# Patient Record
Sex: Male | Born: 1935 | Race: White | Hispanic: No | Marital: Married
Health system: Southern US, Community
[De-identification: ages and names within clinical notes are randomized; demographics above are authoritative.]

---

## 2006-02-12 ENCOUNTER — Ambulatory Visit: Payer: Self-pay | Admitting: Gastroenterology

## 2010-03-25 ENCOUNTER — Inpatient Hospital Stay: Payer: Self-pay | Admitting: Internal Medicine

## 2010-06-29 ENCOUNTER — Ambulatory Visit: Payer: Self-pay | Admitting: Gastroenterology

## 2011-01-08 ENCOUNTER — Ambulatory Visit: Payer: Self-pay | Admitting: Family Medicine

## 2011-01-23 ENCOUNTER — Ambulatory Visit: Payer: Self-pay | Admitting: Internal Medicine

## 2011-01-24 ENCOUNTER — Ambulatory Visit: Payer: Self-pay | Admitting: Specialist

## 2011-03-22 ENCOUNTER — Ambulatory Visit: Payer: Self-pay | Admitting: Cardiothoracic Surgery

## 2011-04-30 ENCOUNTER — Inpatient Hospital Stay: Payer: Self-pay | Admitting: Internal Medicine

## 2011-05-21 ENCOUNTER — Encounter: Payer: Self-pay | Admitting: Internal Medicine

## 2011-06-12 ENCOUNTER — Observation Stay: Payer: Self-pay | Admitting: Internal Medicine

## 2011-06-19 ENCOUNTER — Encounter: Payer: Self-pay | Admitting: Internal Medicine

## 2011-06-29 ENCOUNTER — Inpatient Hospital Stay: Payer: Self-pay | Admitting: Student

## 2011-06-29 ENCOUNTER — Ambulatory Visit: Payer: Self-pay | Admitting: Internal Medicine

## 2011-07-02 ENCOUNTER — Ambulatory Visit: Payer: Self-pay | Admitting: Internal Medicine

## 2011-07-05 ENCOUNTER — Encounter: Payer: Self-pay | Admitting: Internal Medicine

## 2011-07-13 ENCOUNTER — Inpatient Hospital Stay: Payer: Self-pay | Admitting: Internal Medicine

## 2011-08-02 ENCOUNTER — Ambulatory Visit: Payer: Self-pay | Admitting: Internal Medicine

## 2011-08-05 ENCOUNTER — Encounter: Payer: Self-pay | Admitting: Internal Medicine

## 2011-09-01 ENCOUNTER — Encounter: Payer: Self-pay | Admitting: Internal Medicine

## 2011-09-01 ENCOUNTER — Ambulatory Visit: Payer: Self-pay | Admitting: Internal Medicine

## 2011-10-02 ENCOUNTER — Encounter: Payer: Self-pay | Admitting: Internal Medicine

## 2011-11-02 ENCOUNTER — Encounter: Payer: Self-pay | Admitting: Internal Medicine

## 2011-11-13 ENCOUNTER — Inpatient Hospital Stay: Payer: Self-pay | Admitting: Internal Medicine

## 2011-11-13 LAB — COMPREHENSIVE METABOLIC PANEL
Albumin: 2.1 g/dL — ABNORMAL LOW (ref 3.4–5.0)
Alkaline Phosphatase: 87 U/L (ref 50–136)
BUN: 30 mg/dL — ABNORMAL HIGH (ref 7–18)
Bilirubin,Total: 0.5 mg/dL (ref 0.2–1.0)
Chloride: 100 mmol/L (ref 98–107)
Creatinine: 1.1 mg/dL (ref 0.60–1.30)
EGFR (African American): >60
Glucose: 207 mg/dL — ABNORMAL HIGH (ref 65–99)
Osmolality: 290 (ref 275–301)
SGOT(AST): 34 U/L (ref 15–37)
SGPT (ALT): 40 U/L
Sodium: 139 mmol/L (ref 136–145)
Total Protein: 6.7 g/dL (ref 6.4–8.2)

## 2011-11-13 LAB — CBC
HCT: 38.8 % — ABNORMAL LOW (ref 40.0–52.0)
MCH: 27.9 pg (ref 26.0–34.0)
MCV: 88 fL (ref 80–100)
Platelet: 185 10*3/uL (ref 150–440)
RBC: 4.39 10*6/uL — ABNORMAL LOW (ref 4.40–5.90)
RDW: 18 % — ABNORMAL HIGH (ref 11.5–14.5)
WBC: 15.6 10*3/uL — ABNORMAL HIGH (ref 3.8–10.6)

## 2011-11-13 LAB — URINALYSIS, COMPLETE
Bilirubin,UR: NEGATIVE
Ketone: NEGATIVE
Ph: 6 (ref 4.5–8.0)
Protein: 100
WBC UR: 230 /HPF (ref 0–5)

## 2011-11-13 LAB — CK TOTAL AND CKMB (NOT AT ARMC): CK, Total: 25 U/L — ABNORMAL LOW (ref 35–232)

## 2011-11-13 LAB — TROPONIN I: Troponin-I: 0.07 ng/mL — ABNORMAL HIGH

## 2011-11-14 LAB — CBC WITH DIFFERENTIAL/PLATELET
Basophil #: 0 10*3/uL (ref 0.0–0.1)
Eosinophil #: 0.1 10*3/uL (ref 0.0–0.7)
HCT: 31.7 % — ABNORMAL LOW (ref 40.0–52.0)
Lymphocyte #: 1.2 10*3/uL (ref 1.0–3.6)
Lymphocyte %: 12.7 %
MCH: 27.8 pg (ref 26.0–34.0)
MCHC: 32 g/dL (ref 32.0–36.0)
Monocyte #: 0.3 10*3/uL (ref 0.0–0.7)
Neutrophil %: 83.1 %
RBC: 3.65 10*6/uL — ABNORMAL LOW (ref 4.40–5.90)
RDW: 18.4 % — ABNORMAL HIGH (ref 11.5–14.5)
WBC: 9.1 10*3/uL (ref 3.8–10.6)

## 2011-11-14 LAB — COMPREHENSIVE METABOLIC PANEL
Albumin: 1.7 g/dL — ABNORMAL LOW (ref 3.4–5.0)
BUN: 25 mg/dL — ABNORMAL HIGH (ref 7–18)
Bilirubin,Total: 0.3 mg/dL (ref 0.2–1.0)
Calcium, Total: 8.3 mg/dL — ABNORMAL LOW (ref 8.5–10.1)
Chloride: 101 mmol/L (ref 98–107)
Creatinine: 0.96 mg/dL (ref 0.60–1.30)
EGFR (African American): >60
EGFR (Non-African Amer.): >60
Glucose: 83 mg/dL (ref 65–99)
Osmolality: 279 (ref 275–301)
Potassium: 4.5 mmol/L (ref 3.5–5.1)
SGOT(AST): 28 U/L (ref 15–37)
Sodium: 138 mmol/L (ref 136–145)
Total Protein: 5.5 g/dL — ABNORMAL LOW (ref 6.4–8.2)

## 2011-11-14 LAB — CK TOTAL AND CKMB (NOT AT ARMC): CK, Total: 28 U/L — ABNORMAL LOW (ref 35–232)

## 2011-11-14 LAB — MAGNESIUM
Magnesium: 1.6 mg/dL — ABNORMAL LOW
Magnesium: 2.1 mg/dL

## 2011-11-14 LAB — TROPONIN I
Troponin-I: 0.15 ng/mL — ABNORMAL HIGH
Troponin-I: 0.16 ng/mL — ABNORMAL HIGH

## 2011-11-15 LAB — BASIC METABOLIC PANEL
Anion Gap: 8 (ref 7–16)
BUN: 17 mg/dL (ref 7–18)
Calcium, Total: 8.5 mg/dL (ref 8.5–10.1)
Creatinine: 0.68 mg/dL (ref 0.60–1.30)
EGFR (African American): >60
EGFR (Non-African Amer.): >60
Glucose: 191 mg/dL — ABNORMAL HIGH (ref 65–99)
Osmolality: 284 (ref 275–301)

## 2011-11-15 LAB — CBC WITH DIFFERENTIAL/PLATELET
Basophil %: 0.1 %
Eosinophil %: 0 %
HGB: 9.7 g/dL — ABNORMAL LOW (ref 13.0–18.0)
Lymphocyte #: 0.8 10*3/uL — ABNORMAL LOW (ref 1.0–3.6)
MCH: 27.7 pg (ref 26.0–34.0)
MCV: 87 fL (ref 80–100)
Monocyte #: 0.1 10*3/uL (ref 0.0–0.7)
Monocyte %: 1.6 %
Neutrophil #: 6.6 10*3/uL — ABNORMAL HIGH (ref 1.4–6.5)
RBC: 3.5 10*6/uL — ABNORMAL LOW (ref 4.40–5.90)

## 2011-11-16 LAB — URINE CULTURE

## 2011-11-16 LAB — CULTURE, BLOOD (SINGLE)

## 2011-11-16 LAB — DIGOXIN LEVEL: Digoxin: 1.87 ng/mL

## 2011-11-17 LAB — COMPREHENSIVE METABOLIC PANEL
Albumin: 1.8 g/dL — ABNORMAL LOW (ref 3.4–5.0)
Alkaline Phosphatase: 69 U/L (ref 50–136)
BUN: 21 mg/dL — ABNORMAL HIGH (ref 7–18)
Bilirubin,Total: 0.3 mg/dL (ref 0.2–1.0)
Co2: 33 mmol/L — ABNORMAL HIGH (ref 21–32)
Creatinine: 0.67 mg/dL (ref 0.60–1.30)
Osmolality: 290 (ref 275–301)
SGPT (ALT): 53 U/L
Sodium: 140 mmol/L (ref 136–145)
Total Protein: 5.4 g/dL — ABNORMAL LOW (ref 6.4–8.2)

## 2011-11-17 LAB — CBC WITH DIFFERENTIAL/PLATELET
Basophil #: 0 10*3/uL (ref 0.0–0.1)
Basophil %: 0.3 %
Eosinophil %: 0 %
HCT: 28.2 % — ABNORMAL LOW (ref 40.0–52.0)
HGB: 9.1 g/dL — ABNORMAL LOW (ref 13.0–18.0)
Lymphocyte #: 0.6 10*3/uL — ABNORMAL LOW (ref 1.0–3.6)
Lymphocyte %: 9 %
MCV: 86 fL (ref 80–100)
Monocyte %: 3.4 %
Neutrophil #: 6.3 10*3/uL (ref 1.4–6.5)
RBC: 3.26 10*6/uL — ABNORMAL LOW (ref 4.40–5.90)
WBC: 7.2 10*3/uL (ref 3.8–10.6)

## 2011-12-31 DEATH — deceased

## 2012-09-20 IMAGING — US US EXTREM LOW VENOUS*R*
1 series · 17 of 24 positions shown · non-contrast
Comparison: none

REASON FOR EXAM: swelling
COMMENTS:

[Series 1: us extrem low venous*right* · 17 of 36 slices shown]
[im 1/36]
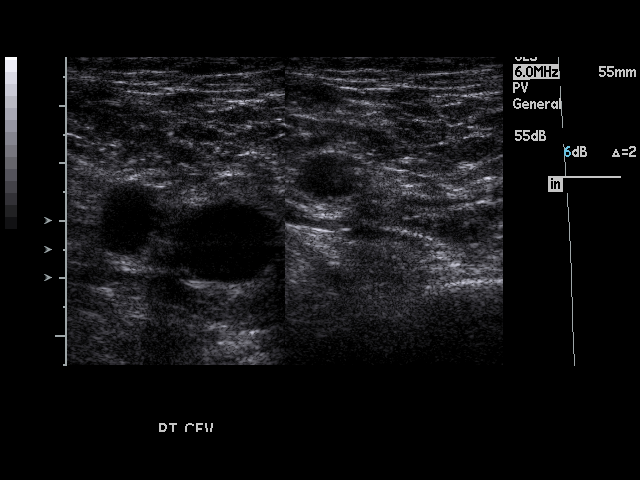
[im 4/36]
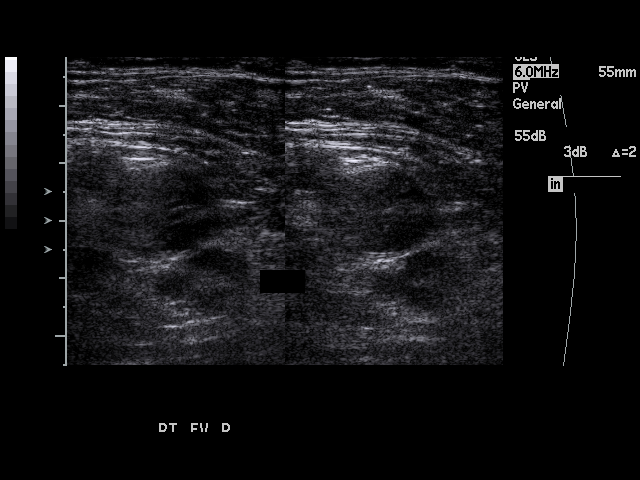
[im 5/36]
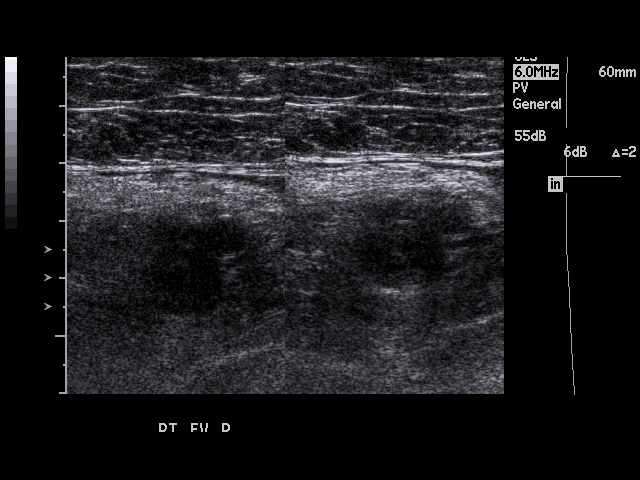
[im 7/36]
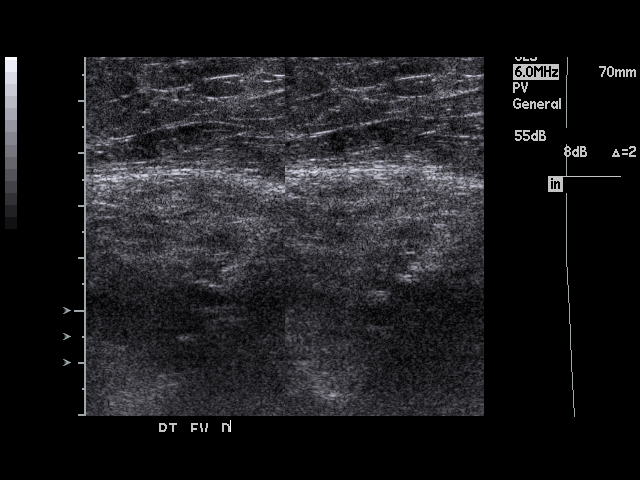
[im 10/36]
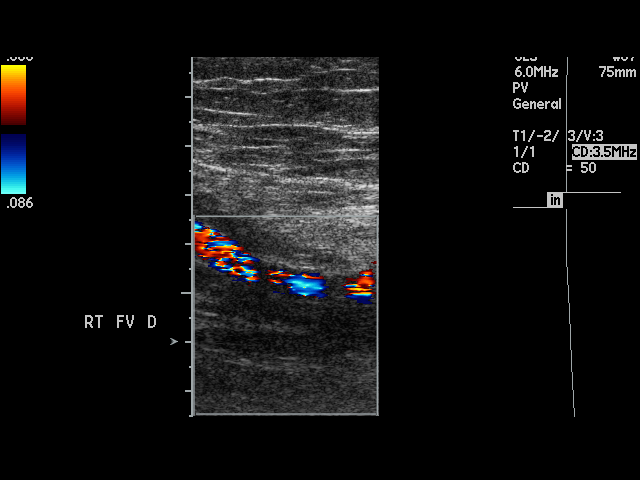
[im 11/36]
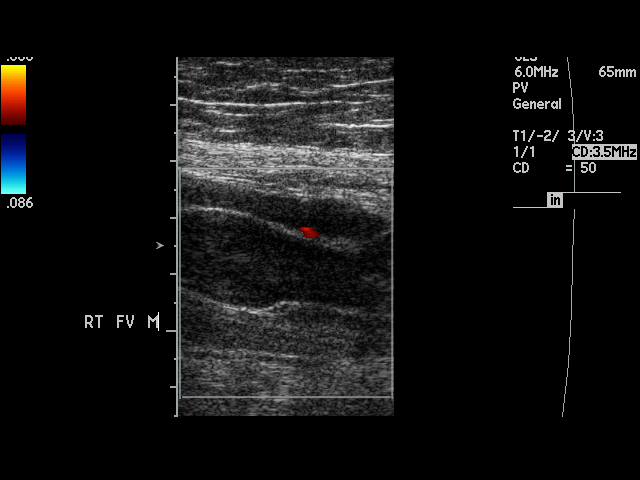
[im 14/36]
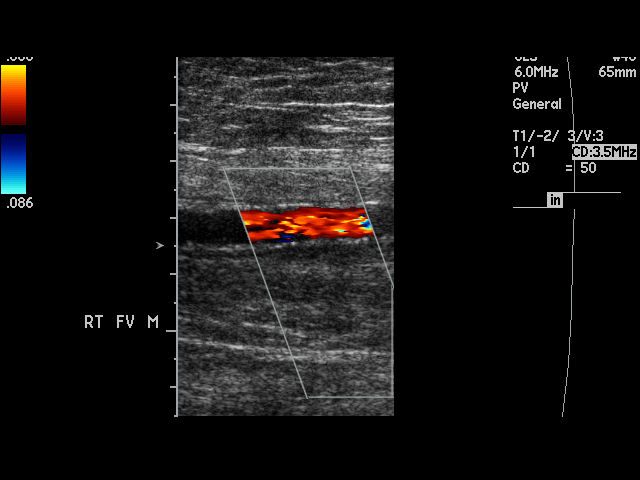
[im 16/36]
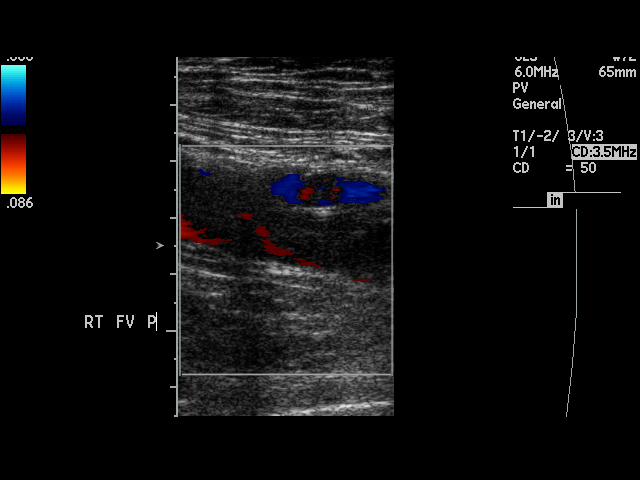
[im 19/36]
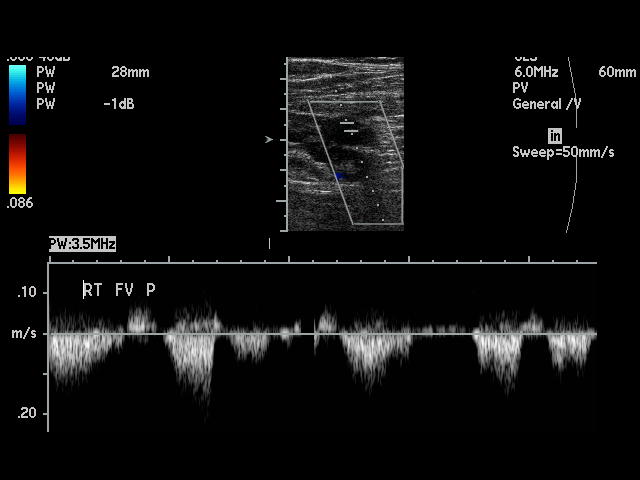
[im 20/36]
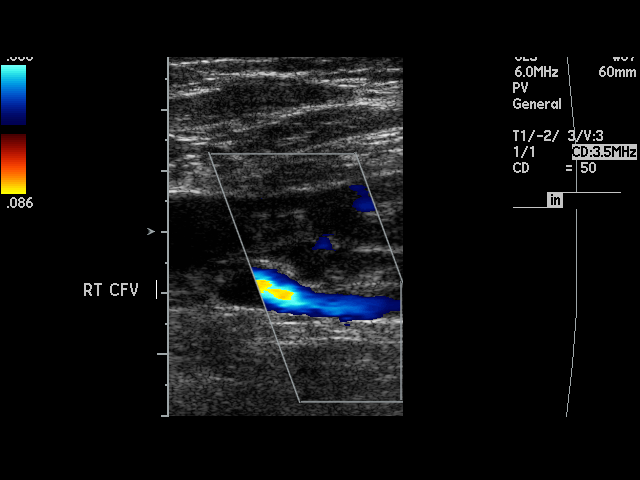
[im 22/36]
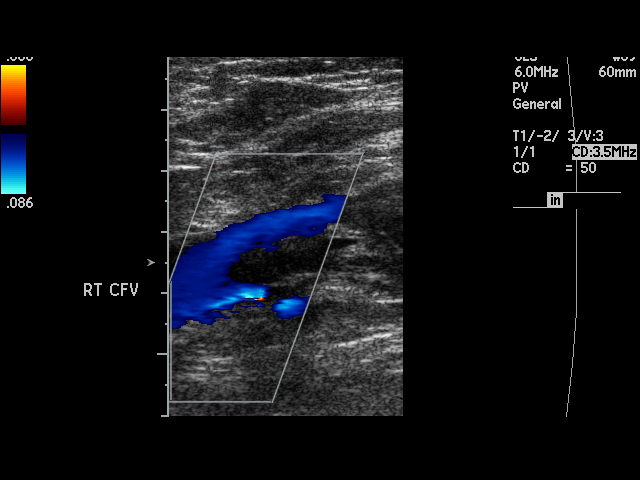
[im 25/36]
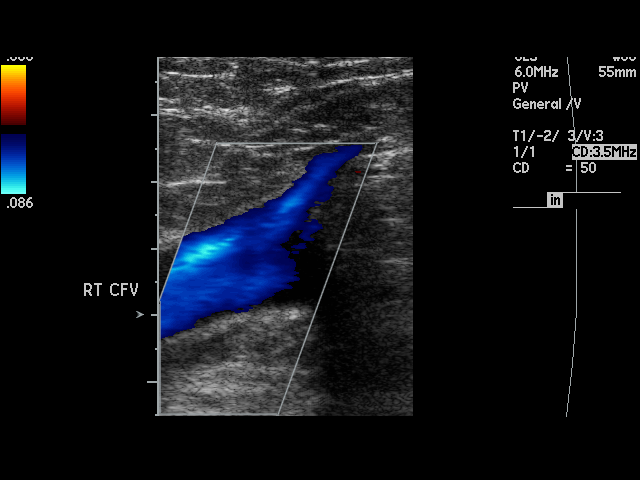
[im 26/36]
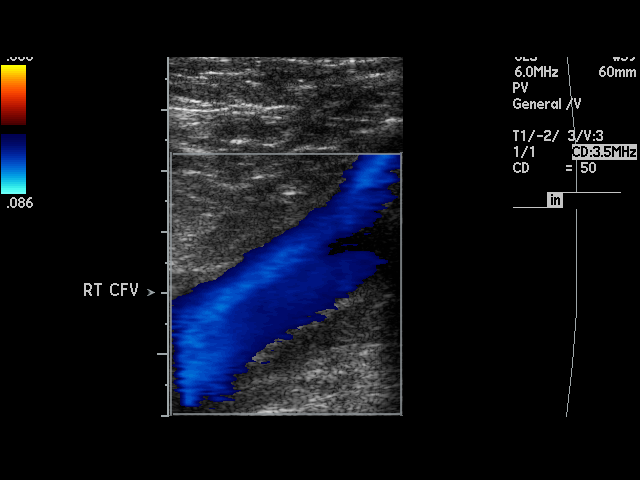
[im 29/36]
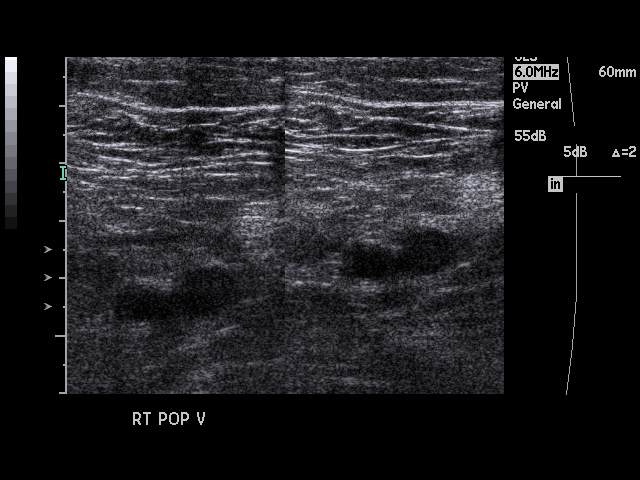
[im 31/36]
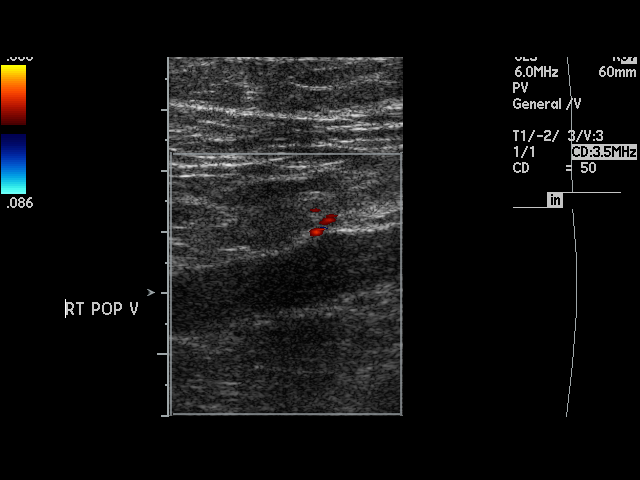
[im 32/36]
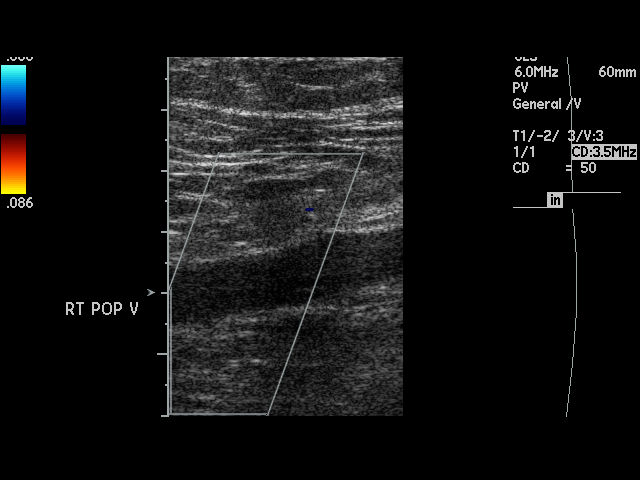
[im 36/36]
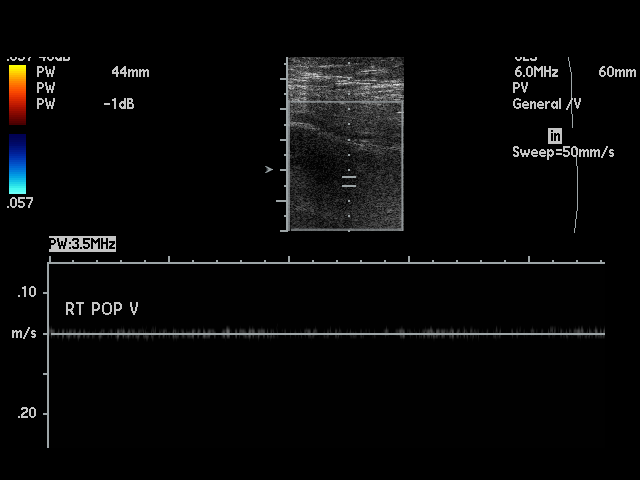

[17 of 24 positions shown; findings below may reference images not displayed]

PROCEDURE:     US  - US DOPPLER LOW EXTR RIGHT  - July 17, 2011 [DATE]

RESULT:     There is extensive occlusive or near occlusive thrombus
involving the lower common femoral, the superficial femoral and the
popliteal. The upper common femoral and saphenous is patent with no thrombus
seen. A small amount of flow is noted in the upper superficial femoral but
otherwise the thrombus appears to be occlusive. Thrombus is also seen in the
profunda.
IMPRESSION: There is extensive deep vein thrombosis on the right as noted above.

## 2012-09-25 IMAGING — US THYROID ULTRASOUND
1 series · 8 of 8 positions shown · non-contrast
Comparison: none

REASON FOR EXAM: swelling  under neck evealuate for hematoma
COMMENTS:

PROCEDURE:     US  - US SOFT TISSUE HEAD/NECK  - July 22, 2011  [DATE]
RESULT:     Comparison: None.
TECHNIQUE: Multiple grayscale images were obtained of the soft tissues of
the neck under the chin in the region of the "bruised area."

[Series 1: thyroid ultrasound · 8 of 8 slices shown]
[im 1/8]
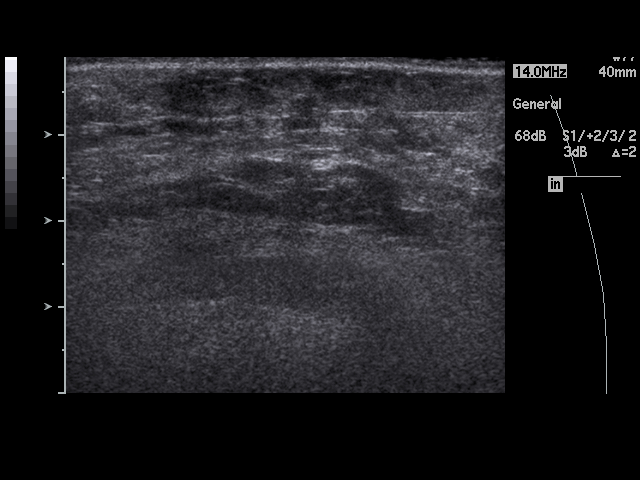
[im 2/8]
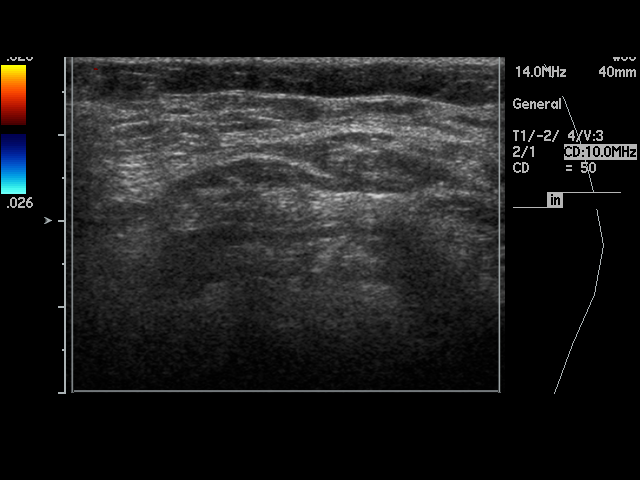
[im 3/8]
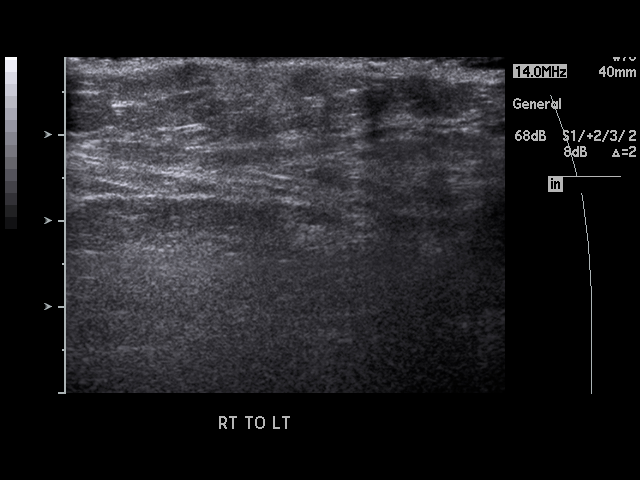
[im 4/8]
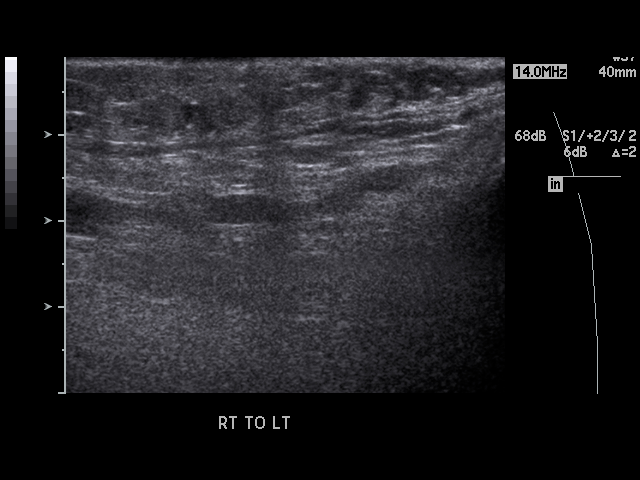
[im 5/8]
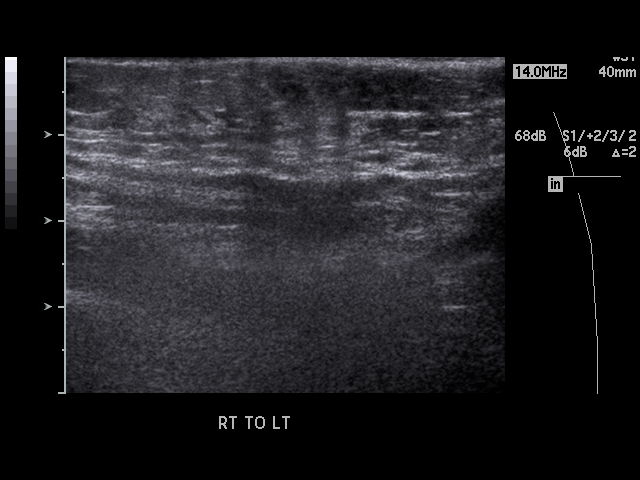
[im 6/8]
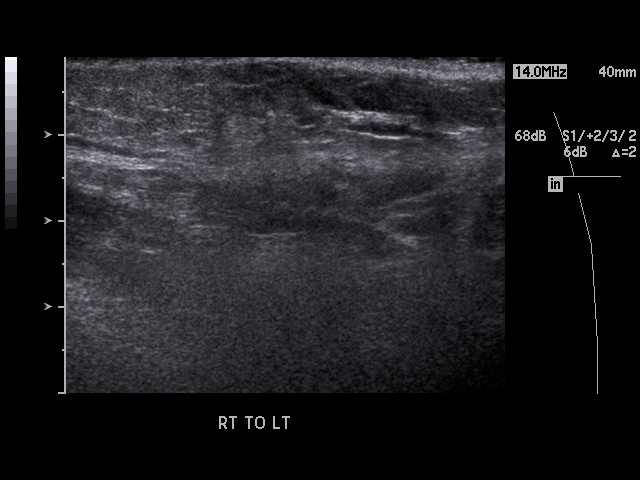
[im 7/8]
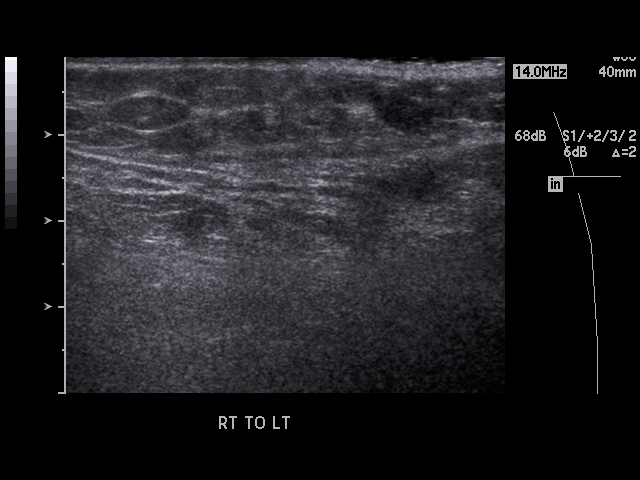
[im 8/8]
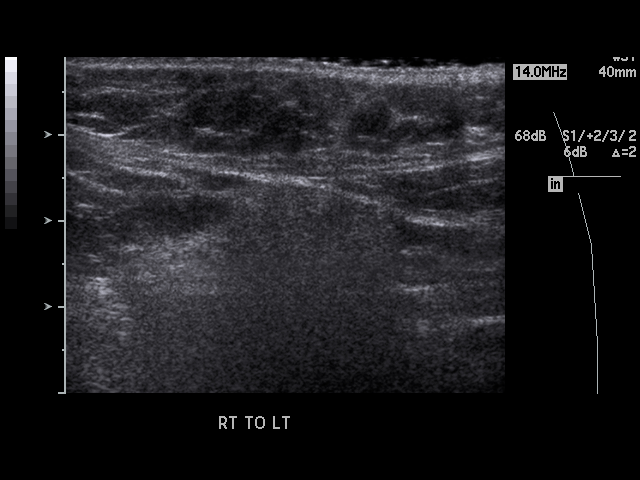

[8 of 8 positions shown; findings below may reference images not displayed]

FINDINGS: There is mild thickening of the soft tissues. However, no discrete fluid
collection or mass identified.
IMPRESSION: Diffuse soft tissue thickening, without discrete fluid collection or mass
identified.

## 2012-09-28 IMAGING — CT CT HEAD WITHOUT CONTRAST
2 series · 15 of 30 positions shown, 19 images · non-contrast
Comparison: none

REASON FOR EXAM: bleeding in L eye..pt. on lovenox....PLEASE INCLUDE
BILATERAL ORBITS
COMMENTS:

PROCEDURE:     CT  - CT HEAD WITHOUT CONTRAST  - July 25, 2011  [DATE]
RESULT:     Head CT dated 07/25/2011 comparison made to prior study dated
03/25/2010.
TECHNIQUE: Helical noncontrasted 5 mm sections were obtained from the skull
base through the vertex.

[Series 2: without · axial · non-contrast · 0.44mm/px · z∈[-170,-36]mm · 13 of 33 slices shown, 17 images]
[im 3/33  brain]
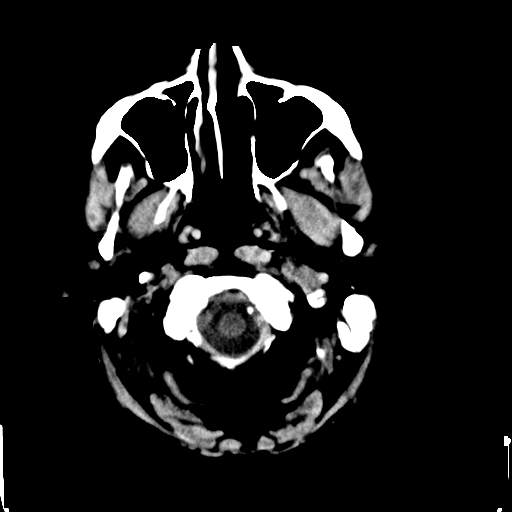
[im 3/33  bone]
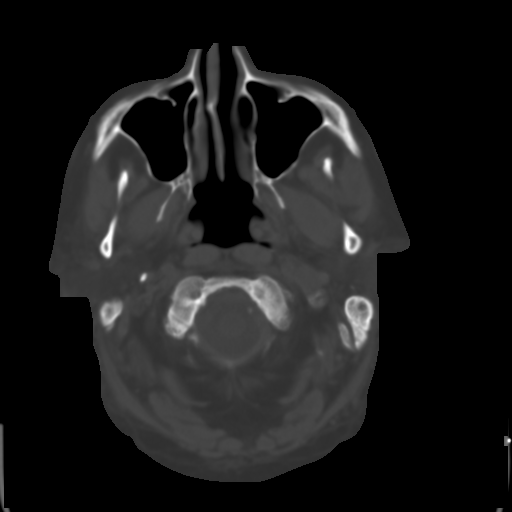
[im 5/33  brain]
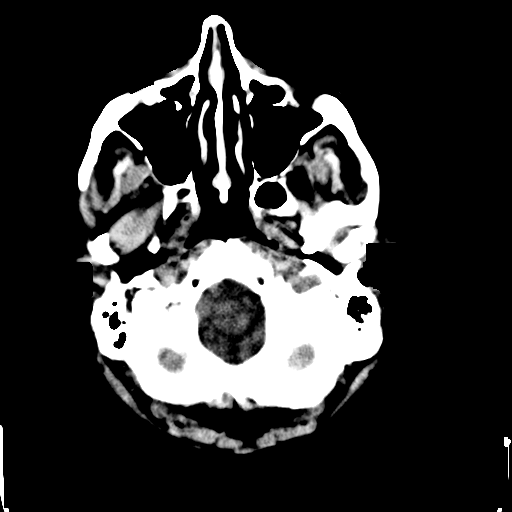
[im 7/33  brain]
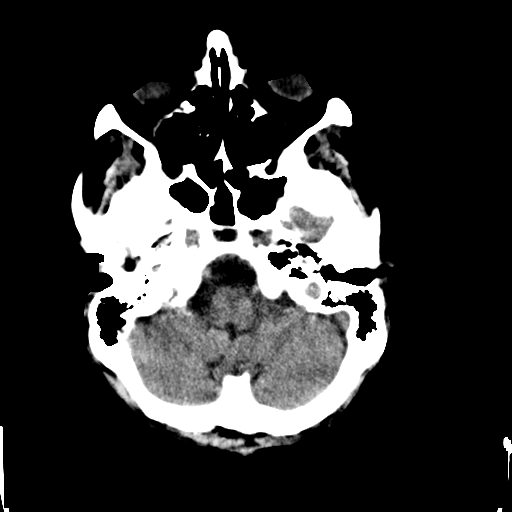
[im 10/33  brain]
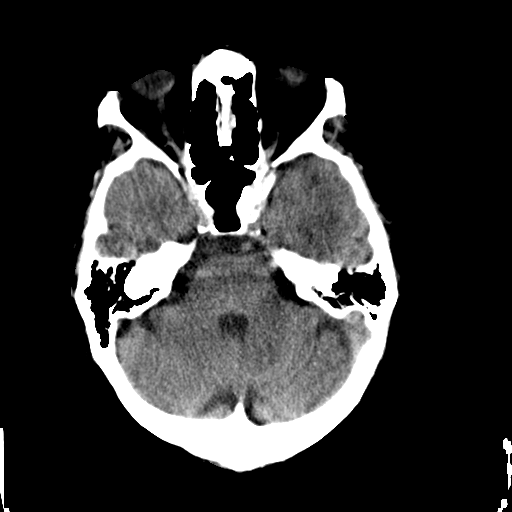
[im 12/33  brain]
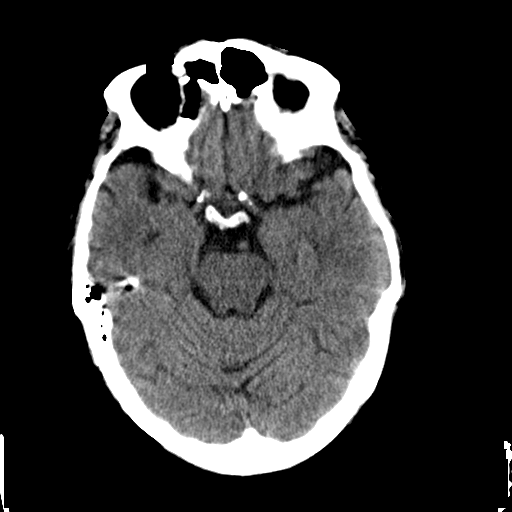
[im 12/33  bone]
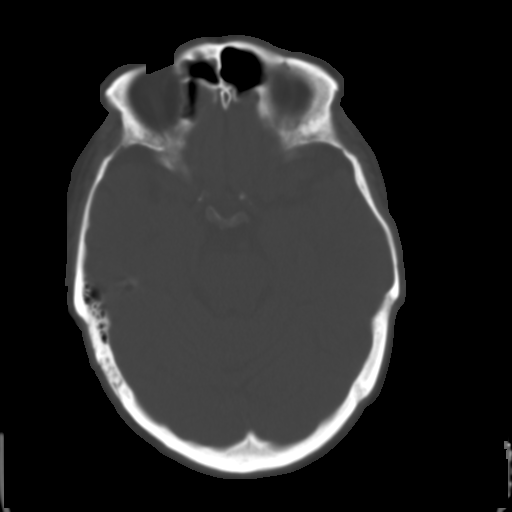
[im 14/33  brain]
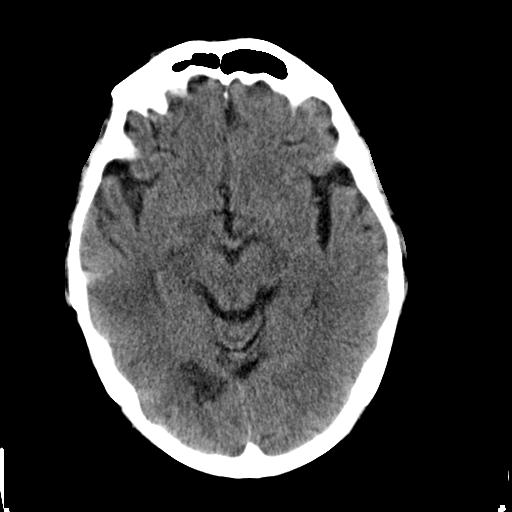
[im 17/33  brain]
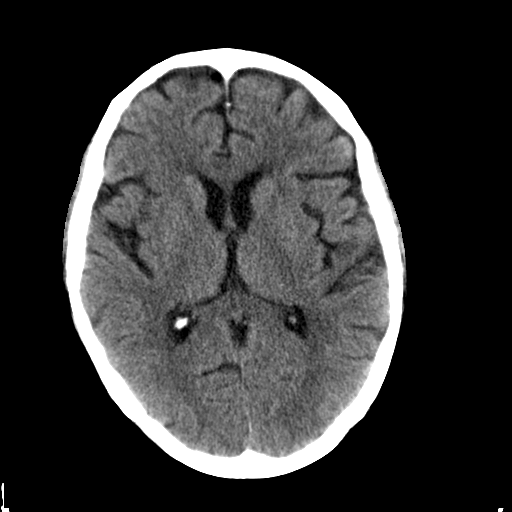
[im 19/33  brain]
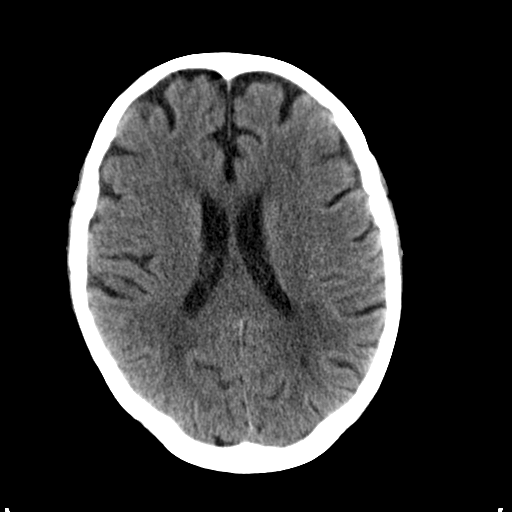
[im 21/33  brain]
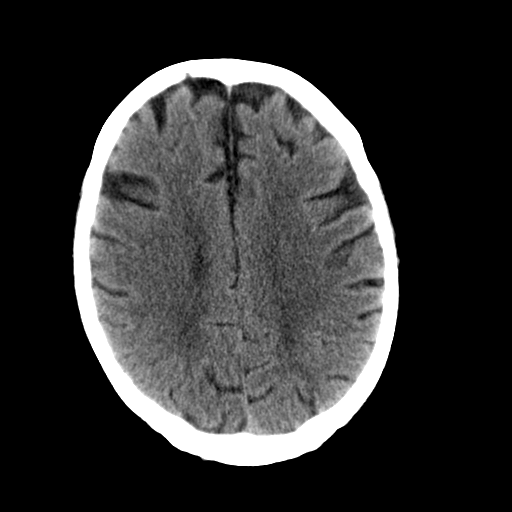
[im 21/33  bone]
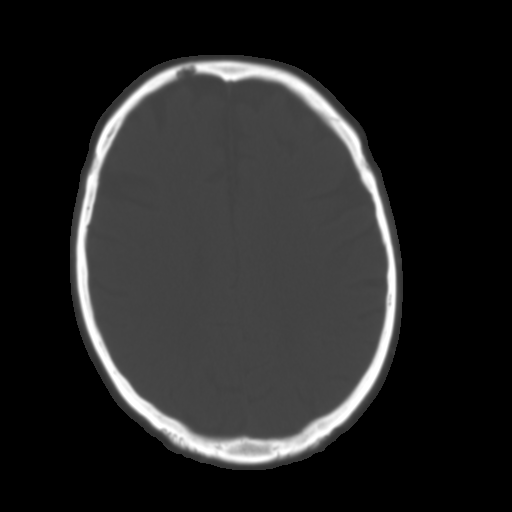
[im 23/33  brain]
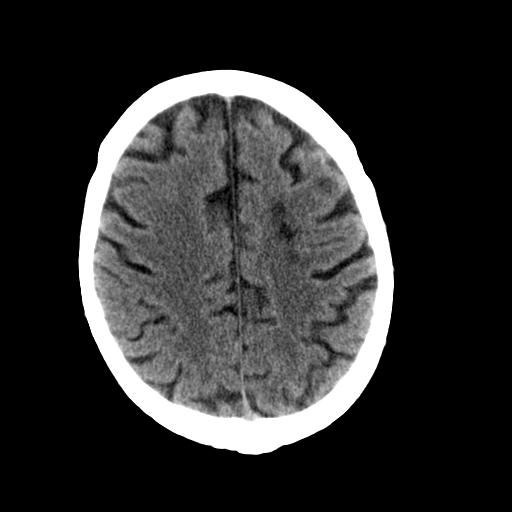
[im 26/33  brain]
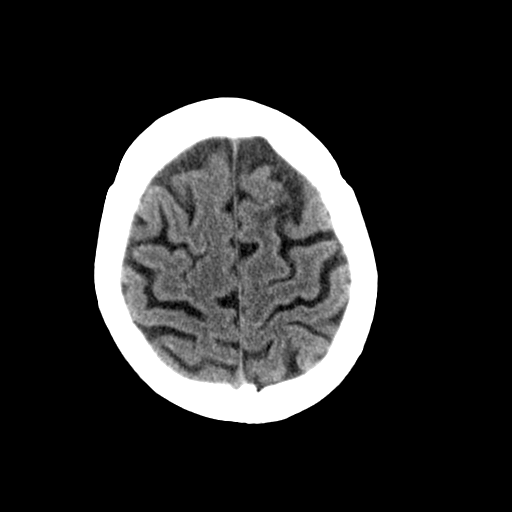
[im 28/33  brain]
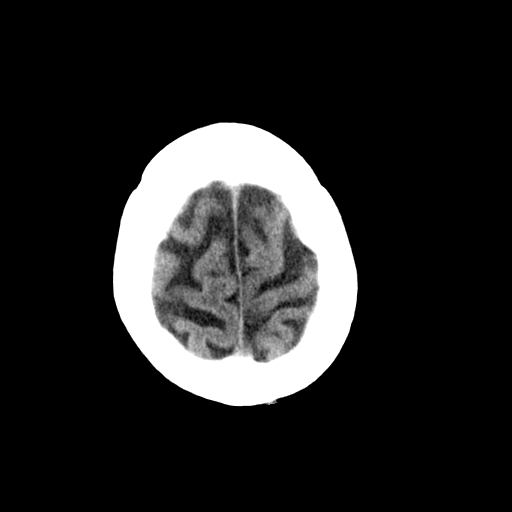
[im 30/33  brain]
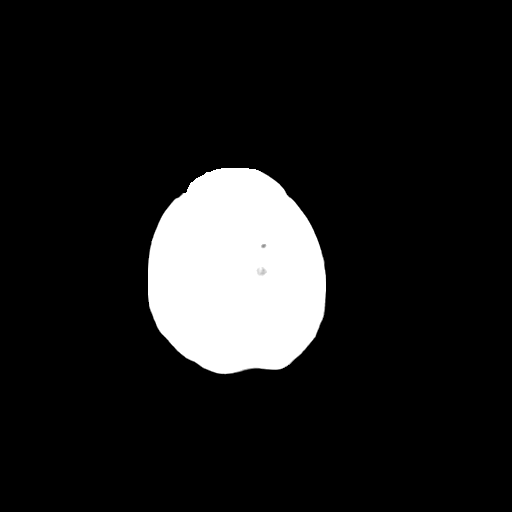
[im 30/33  bone]
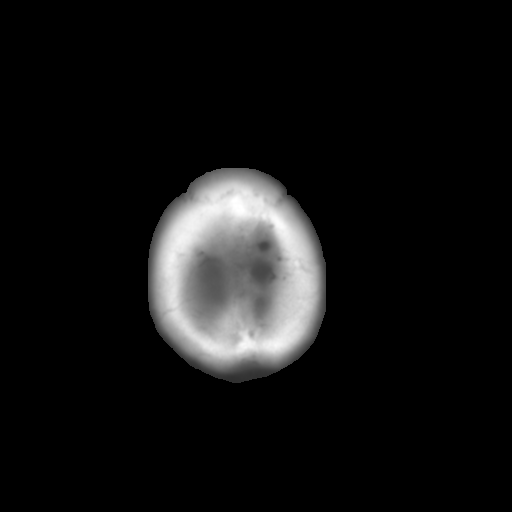

[Series 3: bone · axial · 0.44mm/px · z∈[-170,-150]mm · 2 of 33 slices shown]
[im 3/33  bone]
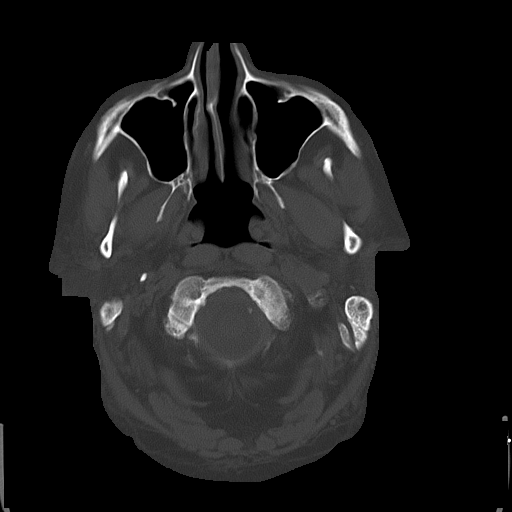
[im 7/33  bone]
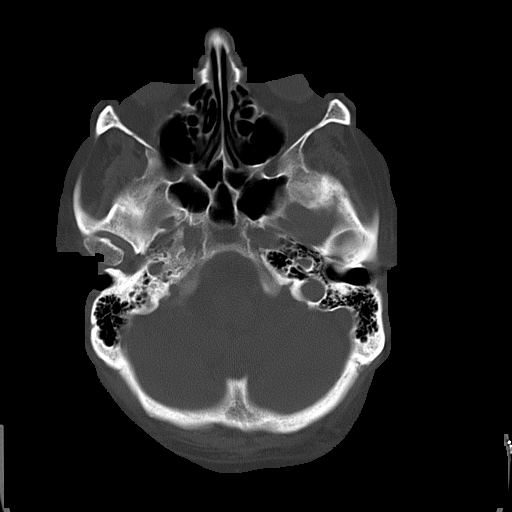

[15 of 30 positions shown; findings below may reference images not displayed]

FINDINGS: There is diffuse cortical atrophy and diffuse areas of low
attenuation within the subcortical, deep, and periventricular white matter
regions. Focal areas of low attenuation project within the left frontal lobe
as well as the right occipital lobe consistent with areas of chronic focal
infarction. There is otherwise no evidence of intra-axial nor extra-axial
fluid collections nor evidence of acute hemorrhage. There is no evidence of
subfalcine or tonsillar herniation. There is no CT evidence of retrobulbar
or intrabulbar hematoma. The pons and cerebellum are grossly unremarkable.
The ventricles are patent. There is no evidence of a depressed skull
fracture.
IMPRESSION: Chronic and involutional changes without evidence of acute
abnormalities.

## 2015-01-23 NOTE — Consult Note (Signed)
PATIENT NAME:  Dalton Drake, Dalton Drake MR#:  [id number] DATE OF BIRTH:  May 18, 1934  DATE OF CONSULTATION:  11/14/2011  REFERRING PHYSICIAN:  Dr. Allena KatzPatel  CONSULTING PHYSICIAN:  Dalton MillardAlexander Ronisha Herringshaw, MD  PRIMARY CARE PHYSICIAN: Jerl MinaJames Hedrick, MD   CHIEF COMPLAINT: Shortness of breath.   HISTORY OF PRESENT ILLNESS: The patient is a 81 year old gentleman with multiple comorbidities including end-stage pulmonary fibrosis, congestive heart failure, and atrial fibrillation who is admitted with respiratory failure and urosepsis. The patient currently lives at home cared for by his wife with home Hospice daily. The patient is bedridden at baseline. The patient was [facility] Room with patient in respiratory distress and tachycardic with hypotension. The patient was admitted to the CCU with diagnosis of urosepsis and respiratory failure. EKG reveals narrow complex tachycardia, rate of 120, probable atrial flutter. Admission labs were notable for borderline elevated troponin of 0.125.   PAST MEDICAL HISTORY:  1. Cardiomyopathy. 2. Atrial fibrillation.  3. End-stage pulmonary fibrosis. 4. History of cerebrovascular accident.  5. Hyperlipidemia.  6. History of deep venous thrombosis and PE.   MEDICATIONS ON ADMISSION:  1. Xarelto 20 mg daily.  2. K-Dur 20 mEq daily.  3. Atorvastatin 40 mg daily.  4. Diltiazem 300 mg daily.  5. Lasix 20 mg daily.  6. Lanoxin 0.125 mg daily.  7. Metoprolol 50 mg t.i.d.  8. Flonase nasal spray. 9. Requip 1 mg at bedtime.  10. Senna Plus one tablet b.i.d.  11. Azathioprine 50 mg daily.  12. Prednisone 20 mg t.i.d.  13. Ambien 5 mg at bedtime p.r.n.  14. Loperamide 4 mg p.r.n.  15. NovoLog sliding scale. 16. MiraLAX 17 grams p.o. p.r.n.   SOCIAL HISTORY: The patient currently lives alone, cared for by his wife with home Hospice.   FAMILY HISTORY: No immediate family history of coronary artery disease or myocardial infarction.   REVIEW OF SYSTEMS: Not  obtainable.   PHYSICAL EXAMINATION:   VITAL SIGNS: Blood pressure 98/42, pulse 120, respirations 20, pulse oximetry 93%, temperature 98.4.   HEENT: The patient currently is intubated.   LUNGS: Decreased breath sounds in both bases.   HEART: Normal JVP. Normal PMI. Regular rate and rhythm. Tachycardia. Normal S1, S2. No appreciable gallop, murmur, or rub.   ABDOMEN: Soft, nontender without hepatosplenomegaly.   EXTREMITIES: No cyanosis, clubbing, or edema. Pulses were intact bilaterally.   MUSCULOSKELETAL: Normal muscle tone.   NEUROLOGIC: The patient is intubated and heavily sedated.   IMPRESSION: This is a 81 year old gentleman with end-stage pulmonary fibrosis who presents with respiratory failure and urosepsis with narrow complex tachycardia, probable atrial flutter in light of the patient's history of atrial fibrillation. The patient is currently intubated and not receiving Cardizem or metoprolol. Blood pressure is marginal.   RECOMMENDATIONS:  1. Agree with overall current therapy.  2. Agree with digoxin intravenously for now.  3. Would defer amiodarone for rate control since the patient has severe pulmonary fibrosis at baseline.   4. Continue intravenous furosemide as needed. 5. Resume beta-blocker and calcium channel blocker when blood pressure stabilizes.   ____________________________ Dalton MillardAlexander Luciann Gossett, MD ap:drc D: 11/14/2011 13:04:40 ET T: 11/14/2011 13:23:08 ET JOB#: [id number]  cc: Dalton MillardAlexander Delmont Prosch, MD, <Dictator> Dalton MillardALEXANDER Zeriyah Wain MD ELECTRONICALLY SIGNED 11/16/2011 10:57

## 2015-01-23 NOTE — Consult Note (Signed)
PATIENT NAME:  Dalton Drake, Dalton Drake MR#:  [id number] DATE OF BIRTH:  06-27-1934  DATE OF CONSULTATION:  11/14/2011  REFERRING PHYSICIAN:  Enedina FinnerSona Patel, MD CONSULTING PHYSICIAN:  Rosalyn GessMichael E. Deanglo Hissong, MD  REASON FOR CONSULTATION: Bacteremia.   HISTORY OF PRESENT ILLNESS: The patient is a 81 year old white man with a past history significant for chronic obstructive pulmonary disease, pulmonary fibrosis, chronic respiratory failure, deep venous thrombosis and pulmonary embolus who was admitted yesterday with hypoxia. Apparently the patient was found to have increasing shortness of breath while getting his ears checked. He was sent to the Emergency Room and was hypotensive and tachycardic. He was put on BiPAP and his oxygenation improved. He was admitted to the Critical Care Unit for further evaluation. He is unable to provide any history due to his confusion. Since admission he was initially started on ceftriaxone for possible urinary tract infection. His chest x-ray showed evidence for fluid, but no obvious infiltrates. His white count was elevated on admission at 15.6 and down to 9.1 today. Blood cultures overnight became positive for gram-positive cocci in four out of four bottles. Vancomycin was started last night. He has had temperatures as high as 100.5 since being in the hospital. Per the history and physical, he had been treated with oral antibiotics for possible urinary tract infection. The patient has had a chronic Foley catheter in place since he was discharged to home Hospice in November and none of the cultures are available for review. He apparently has been treated with Levaquin and Cipro and subsequently developed cultures suggestive of resistance to fluoroquinolones and changed to Bactrim. The patient is unable to provide any history and the history is obtained exclusively from the chart.   ALLERGIES: Ambien.   PAST MEDICAL HISTORY:  1. Pulmonary fibrosis.  2. Chronic obstructive pulmonary disease.   3. Chronic respiratory failure.  4. Hypercholesterolemia.  5. Stroke.  6. Chronic urinary retention with Foley catheter in place since November 2012.  7. Deep venous thrombosis and pulmonary embolus.  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 9. Atrial fibrillation.  10. Congestive heart failure.   SOCIAL HISTORY: The patient lives at home and is followed by Hospice. He does not smoke nor does he drink.   FAMILY HISTORY: Positive for bladder cancer and myocardial infarction.   REVIEW OF SYSTEMS: Unable to obtain from the patient due to his confusion.   PHYSICAL EXAMINATION:  VITAL SIGNS: T-max of 100.5, T-current 98.4, pulse 120, blood pressure 106/48, 93% on 50% high-flow nasal cannula.   GENERAL: 81 year old white man, markedly confused but in no acute distress.   HEENT: Normocephalic, atraumatic. Pupils appeared to be equally reactive. Sclerae, conjunctivae, and lids are without evidence for emboli or petechiae.  Oropharynx was unable to be examined due to the patient's compliance.   NECK: Midline trachea. No lymphadenopathy. No thyromegaly.   CHEST: Decreased air movement throughout the lung fields. There is no focal consolidation. There were no crackles or wheezes appreciated.   CARDIAC: Tachycardic. Regular rhythm without murmur, rub, or gallop.   ABDOMEN: Soft. No apparent tenderness. No distention. No hepatosplenomegaly. No hernias noted.   EXTREMITIES: He had bilateral lower extremity edema. No evidence for tenosynovitis.   SKIN: No rashes. No stigmata of endocarditis, specifically no Janeway lesions or Osler nodes.   NEUROLOGIC: The patient would open his eyes to voice but was unable to provide any history. He would try to follow simple commands such as opening his mouth, but could not physically appear to do so.  PSYCHIATRIC: Unable to assess due to his current clinical condition.   LABS/STUDIES: BUN 25, creatinine 0.96, bicarbonate 32, anion gap of 5. LFTs were unremarkable  other than a total protein of 5.5 and an albumin of 1.7. White count was 9.1 with a hemoglobin 10.1, platelet count 121, ANC 7.6. White count on admission was 15.6. Blood cultures from admission are growing four out of four bottles positive for gram-positive cocci. Urinalysis from admission has 2+ blood, negative nitrites, 2+ leukocyte esterase, 156 red cells and 230 white cells. A chest x-ray showed interstitial edema versus fibrosis that has not been significantly changed. There is possibly low-grade congestive heart failure or interstitial pneumonia present. Repeat chest x-ray showed pulmonary edema.   IMPRESSION: 81 year old white man with a history of chronic obstructive pulmonary disease and pulmonary fibrosis, chronic respiratory failure, congestive heart failure, deep venous thrombosis, and pulmonary embolism admitted with hypoxia, pyuria, and gram-positive cocci in the blood.   RECOMMENDATIONS:  1. If not clear to me that his pulmonary issues are related to infection. He has multiple possible etiologies for his hypoxia including exacerbation or worsening of his underlying chronic obstructive pulmonary disease/fibrosis, worsening of his congestive heart failure, or possible PE. His chest x-ray does not show an obvious pneumonia. He is not currently being treated for pneumonia. I would not change his therapy at this time.  2. His gram-positive cocci in the blood could represent infection versus contamination. We will need to await the results of the blood cultures. He has been started on vancomycin.  We will continue this until the cultures return.  3. His urination issues are difficult to interpret. I do not have the data on his prior cultures. He has a chronic Foley in place and it is not uncommon to have chronic colonization with bacteria in such instances. He has been on antibiotics and the History and Physical indicates that he has developed resistant organisms. This is the pattern that occurs  when colonization is treated extensively. We will wait for the current urine culture results and see if we can get copies of the old cultures to evaluate. He may not need the ceftriaxone.  4. He was discharged in 08/2011 on home Hospice but is currently a FULL CODE and in the Critical Care Unit. Given his endstage chronic lung disease would consider palliative care consult to discuss goals of therapy.   This is a high-level infectious disease consult.       Thank you very much for involving me in Mr. Mullendore care.    ____________________________ Rosalyn Gess. Nevelyn Mellott, MD meb:bjt D: 11/14/2011 12:03:24 ET T: 11/14/2011 12:29:08 ET JOB#: [id number]  cc: Rosalyn Gess. Thursa Emme, MD, <Dictator> Ewell Benassi E Takari Duncombe MD ELECTRONICALLY SIGNED 11/15/2011 13:44

## 2015-01-23 NOTE — H&P (Signed)
PATIENT NAME:  Dalton Drake, Dalton Drake MR#:  [id number] DATE OF BIRTH:  10-13-1933  DATE OF ADMISSION:  11/13/2011  PRIMARY CARE PHYSICIAN: Jerl MinaJames Hedrick, MD  CHIEF COMPLAINT: Increasing respiratory distress and abnormal-looking urine. History is obtained from the patient's son and wife who are at bedside. The patient has very significant impaired hearing, is lethargic, and unable to give any history or review of systems.  HISTORY OF PRESENT ILLNESS: Dalton Drake is a 81 year old who has history of chronic hypoxic respiratory failure secondary to severe pulmonary fibrosis, is on 6 liters nasal cannula oxygen at home, history of pulmonary embolus with lower extremity deep vein thrombosis, on Xarelto shots, atrial fibrillation, congestive heart failure, cardiomyopathy, and history of chronic obstructive pulmonary disease who comes to the Emergency Room from home after the patient went into respiratory distress while family was making arrangements for him to have his ears checked at the audiologist's office for his impaired hearing. Next, in the Emergency Room, the patient was found to be very hypotensive, was tachycardic with a heart rate in the 130s. He received some IV fluids. Blood pressure was stabilized. He became very hypoxic. He was put on BiPAP with 100% FiO2 and his saturations were 93% to 94, PaO2 was 140, and pO2 was 31. The patient was getting very fidgety, was getting very anxious, BiPAP was removed. He took a few sips of water and thereafter his saturations continued to drop into the 60s. He was placed back on nonrebreather which the patient's family says works better for him and we will repeat an ABG, that is pending. He was also found to have significant urinary tract infection and received a dose of 2 grams IV Rocephin. The patient is being admitted for further evaluation and management.   The patient has a chronic Foley catheter for urinary retention, from his last admission in November 2012, and the  catheter was recently changed. He took a course of Levaquin. He continued to have turbid-looking urine. He was halfway through his Cipro course and his urine cultures were resistant to Cipro. He was changed to Bactrim, per Dr. Burnett ShengHedrick. I do not have urine culture results, which were sent by Dr. Webb SilversmithHedrick's in the office, neither is hospice able to give that information. The patient is being admitted for further evaluation and management.   PAST MEDICAL HISTORY:  1. Chronic respiratory failure.  2. Leukocytosis.  3. Anemia of chronic disease.  4. Steroid-induced hyperglycemia.  5. Hyperlipidemia.  6. History of cerebrovascular accident.  7. Impaired hearing.  8. The patient is bedbound.  9. Urinary retention with chronic Foley for the past four months.  10. Conjunctival hemorrhage with use of Lovenox.  11. Extensive deep vein thrombosis with PE, now on Xarelto shots daily.  12. Anxiety.  13. 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   MEDICATIONS: ([facility]) 1. K-Dur 20 milliequivalents p.o. daily. 2. Flonase two sprays to both nostrils before bedtime.  3. Protonix 40 mg p.o. b.i.d.  4. Requip 1 mg at bedtime.  5. Xarelto 20 mg baby.  6. Senna Plus one tablet p.o. b.i.d.  7. Atorvastatin 40 mg p.o. daily.  8. Azathioprine 50 mg daily.  9. Diltiazem 300 mg daily.  10. Lasix 20 mg daily.  11. Lanoxin 0.125 mg p.o. daily.  12. Metoprolol 50 mg three times daily.  13. Prednisone 20 mg p.o. t.i.d. for 14 days.  14. Ambien 5 mg p.o. at bedtime p.r.n.  15. Loperamide 4 mg p.r.n.  16.  NovoLog sliding scale according to the sliding scale insulin.  17. MiraLax 17 grams p.o. p.r.n.   SOCIAL HISTORY: The patient lives at home and is followed by hospice. He is taken care of by his wife and has an aide coming to his home. He is bedbound. He has a chronic Foley. No history of alcohol or smoking.   PAST SURGICAL HISTORY:  1. Tonsillectomy. 2. Oral surgery.   FAMILY  HISTORY: Mother died at age 53 of bladder cancer. Father died of a myocardial infarction.  REVIEW OF SYSTEMS: Unobtainable. The patient is quite lethargic, very hard on hearing.   PHYSICAL EXAMINATION:   GENERAL: The patient appears lethargic, is hard of hearing.   VITALS: He is afebrile, pulse is 133 to 134, respirations 36, blood pressure 112/74, and saturations are 96% on nonrebreather. Morbidly obese.  HEENT: Atraumatic, normocephalic. Pupils are equal, round, and reactive to light and accommodation. Extraocular movements intact. Oral mucosa is dry.   NECK: Supple.   RESPIRATORY: Decreased breath sounds in the bases. The patient is hyperventilating, quite anxious. I could not hear any rhonchi or rales.   CARDIOVASCULAR: Tachycardia present. Both heart sounds are normal. No murmur heard. PMI is not lateralized.   ABDOMEN: Obese, soft, and nontender. Hyperactive bowel sounds.   EXTREMITIES: Pitting pedal edema, 1+, to both lower extremities with some chronic venous stasis changes.   SKIN: The patient does have some have ecchymosis in both his upper extremities with some skin tears, which have been dressed.   NEURO: The patient moves his extremities. The patient is very deconditioned. He is able to move his extremities spontaneously.   PSYCH: The patient is quite lethargic, unable to assess. Again, please note exam was very limited secondary to the patient's mental status.   LABS/STUDIES: pH 7.44, pCO2 37, and pO2 70 with FiO2 of 100%, this is on nonrebreather mask.   Urinalysis is positive for urinary tract infection.   Chest x-ray is consistent with interstitial edema versus fibrosis. No obvious pneumonia.   Troponin 0.07. White count is 15.6, hemoglobin and hematocrit 12.3 and 38.8, and platelet count is 185. Glucose is 207, BUN 30, creatinine 1.10, sodium 139, potassium 5.3, and chloride 100. The rest of the electrolytes are normal including LFTs. Albumin is 2.1. Cardiac  enzymes, first set is negative. PT and INR is 13.0 and 0.9.   ASSESSMENT: 81 year old Dalton Drake with:  1. Systemic antiinflammatory response syndrome/sepsis due to urinary tract infection, appears  catheter-related infection. The patient has been on a chronic Foley for past four months and recently completed a course of Levaquin, half way through Cipro and now on Bactrim for the last few days, unable to get urine culture reports at present. We will try to reach Dr. Webb Silversmith office. The patient received IV Rocephin in the ER.  2. Acute on chronic pulmonary fibrosis with chronic respiratory failure, on nonrebreather with saturations around 70s to 80s.  3. Severe pulmonary fibrosis, on home oxygen with 6 liters nasal cannula oxygen.  4. Anxiety with symptoms of hypoventilating while the patient was on BiPAP, now settled down.  5. History of pulmonary embolus and deep vein thrombosis. Currently the patient is on Xarelto, which I am thinking he is taking for atrial fibrillation. The family states the patient takes Arixtra shots, I will try to confirm these medications with the hospice service.  6. Congestive heart failure, mild, in the setting of sepsis.   PLAN:  1. Admit the patient to the Critical Care Unit.  2. FULL CODE. This was readdressed with the family and hospice. The patient is listed as FULL CODE with hospice services as well.   3. IV fluids at low rate at the present. Use pressors if needed.  4. Continue IV Rocephin and obtain culture results.  5. I will confirm whether the patient is on Xarelto or Arixtra and continue the same.  6. Continue the rest of the home medications as the patient is able to take.  7. Follow blood cultures and urine cultures.  8. Cardiology consultation for tachycardia/atrial fibrillation in the setting of sepsis. We will try to give the patient IV digoxin if he is unable to take p.o. I will not be able to give any calcium channel blocker or any other beta blockers  given his low blood pressure.  9. Cycle cardiac enzymes x3.  10. Follow blood cultures and urine cultures.  11. We will keep the patient n.p.o., except for medications, if he is able to. If he starts coughing up, we will keep him NPO completely and have speech therapy see him in the morning.   Further work-up according to the patient's clinical course. The hospital admission plan was discussed with the patient's family members and hospice triage nurse.   CRITICAL TIME SPENT: 60 minutes. ___________________________ Wylie Hail Allena Katz, MD sap:slb D: 11/13/2011 18:11:38 ET T: 11/14/2011 06:27:55 ET JOB#: [id number]  cc: Nyree Applegate A. Allena Katz, MD, <Dictator> Rhona Leavens. Burnett Sheng, MD Willow Ora MD ELECTRONICALLY SIGNED 11/25/2011 11:36

## 2015-01-23 NOTE — Consult Note (Signed)
PATIENT NAME:  West BaliUL, Kunal MR#:  [id number] DATE OF BIRTH:  1934-04-19  DATE OF CONSULTATION:  11/14/2011  REFERRING PHYSICIAN:   CONSULTING PHYSICIAN:  Annice NeedyJason S. Kaidence Sant, MD  REASON FOR CONSULTATION: Central line placement.   HISTORY OF PRESENT ILLNESS: This is a 81 year old male with chronic pulmonary fibrosis and chronic respiratory insufficiency. He is admitted with worsening respiratory insufficiency requiring high flow oxygen. He has hypotension and signs of sepsis. Despite multiple attempts by nursing staff they have not been able to attain good peripheral access. He is nearly obtunded responding only to painful stimuli at this point. We are asked to place a central line.   PAST MEDICAL HISTORY:  1. Pulmonary fibrosis with chronic respiratory failure.  2. Leukocytosis.  3. Anemia of chronic disease.  4. Hyperglycemia.  5. Hyperlipidemia.  6. Stroke.  7. Urinary retention with chronic Foley catheter.  8. Extensive deep vein thrombosis on Xarelto and status post IVC filter.  9. Thrombocytopenia, suspected HIT.   MEDICATIONS: Per the history and physical include: 1. K-Dur 20 mEq daily.  2. Flonase two sprays both nostrils at bedtime.  3. Protonix 40 mg b.i.d.  4. Requip 1 mg at bedtime.  5. Xarelto 20 mg daily.  6. Senna b.i.d.  7. Atorvastatin 40 mg daily.  8. Azathioprine 50 mg daily.  9. Diltiazem 300 mg daily.  10. Lasix 20 mg daily.  11. Digoxin 0.125 mg daily.  12. Metoprolol 50 mg t.i.d.  13. Prednisone taper.  14. Ambien 5 mg as needed at bedtime.  15. Loperamide 4 mg as needed.  16. Sliding-scale insulin.  17. MiraLax as needed.   SOCIAL HISTORY: The patient apparently lives at home and is followed by hospice. He is bedbound.   PAST SURGICAL HISTORY:  1. Tonsillectomy.  2. Oral surgery.  3. IVC filter placement.   FAMILY HISTORY: Mother died at 1087 of bladder cancer. Father had a heart attack.   REVIEW OF SYSTEMS: Unobtainable as the patient is nearly  obtunded.   PHYSICAL EXAMINATION:  GENERAL: This is a critically ill white male lying in his critical care bed nearly obtunded and only responding to painful stimuli somewhat.   VITAL SIGNS: Temperature 98.4, pulse 122, blood pressure 84/46, saturations 95% on 50% high flow nasal cannula.   HEENT: High flow nasal cannula was in place, otherwise normocephalic and atraumatic. Eyes: Sclerae nonicteric. Conjunctivae are clear.   NECK: Supple without adenopathy or jugular venous distention. Carotids have good upstroke. No bruits.   HEART: Tachycardic and mostly regular.   LUNGS: Distant with wheezes and rhonchi bilaterally.   ABDOMEN: Soft, nondistended, does not appear to have any tenderness.   EXTREMITIES: Have chronic lower extremity stasis changes and moderate edema.  NEUROLOGICAL: Nearly obtunded, only responding to painful stimuli.   LABORATORY, DIAGNOSTIC AND RADIOLOGICAL DATA: Sodium 138, potassium 4.5, chloride 101, CO2 32, BUN 25, creatinine 0.96, glucose 83, white blood cell count 9.1, hemoglobin 10.1, platelet count 121,000.   ASSESSMENT AND PLAN: This is a 81 year old white male with chronic respiratory failure who was admitted with worsening respiratory insufficiency, signs of sepsis and poor venous access. A central line be placed at the bedside.   This is a level-3 consultation.   ____________________________ Annice NeedyJason S. Austen Oyster, MD jsd:cms D: 11/14/2011 09:43:52 ET T: 11/14/2011 10:18:05 ET JOB#: [id number]  cc: Annice NeedyJason S. Marlin Jarrard, MD, <Dictator> Annice NeedyJASON S Sheilyn Boehlke MD ELECTRONICALLY SIGNED 11/23/2011 13:24

## 2015-01-23 NOTE — Discharge Summary (Signed)
PATIENT NAME:  Dalton Drake, Khaidyn MR#:  [id number] DATE OF BIRTH:  1934-09-17  DATE OF ADMISSION:  11/13/2011 DATE OF DISCHARGE:  11/18/2011  ADMITTING PHYSICIAN:  Dr. Enedina FinnerSona Patel DISCHARGING PHYSICIAN: Dr. Enid Baasadhika Kristine Chahal  PRIMARY CARE PHYSICIAN: Dr. Burnett ShengHedrick   CONSULTATIONS IN THE HOSPITAL:  1. Vascular consultation for central line placement by Dr. Wyn Quakerew.  2. Cardiology consultation by Dr. Darrold JunkerParaschos. 3. Pulmonary consultation by Dr. Meredeth IdeFleming.  4. Infectious disease consultation by Dr. Orson AloeMichael Blocker.   DISCHARGE DIAGNOSES:  1. Sepsis with enterococcus bacteremia, likely source is urine.  2. Chronic Foley catheter for urinary retention.  3. History of cerebrovascular accident.  4. Functional quadriplegia, bedbound status. Also has spinal stenosis. 5. Drowsiness secondary to Gabapentin while in the hospital that resolved.  6. Very fragile skin with left arm skin tear secondary to IV line placed on admission.  7. Impaired hearing.  8. Chronic anemia.  9. Steroid-induced hyperglycemia. 10. History of deep venous thrombosis and pulmonary embolism on Arixtra.  11. 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  12. Chronic respiratory failure on 6 liters home oxygen for pulmonary fibrosis.  13. Acute respiratory failure on admission.  14. Atrial fibrillation with rapid ventricular response, converted to normal sinus rhythm but with past history of paroxysmal atrial fibrillation.  15. Elevated troponin on admission secondary to demand ischemia rather than NSTEMI.   DISCHARGE MEDICATIONS:  1. Cardizem 300 mg p.o. daily.  2. Bystolic 5 mg p.o. daily.  3. Requip 3 mg p.o. at bedtime.  4. Restoril 15 mg p.o. at bedtime.  5. Digoxin 0.125 mg p.o. daily.  6. Lasix 20 mg p.o. daily.  7. Amoxicillin 500 mg p.o. q. 8 hours until 12/01/2011.  8. Prednisone 20 mg p.o. b.i.d.   9. Xanax 0.5 mg p.o. b.i.d. p.r.n.  10. Arixtra 7.5 mg subcutaneous injection once a day.  11. Atorvastatin 40 mg p.o. daily.   12. Azathioprine 50 mg p.o. daily.  13. Benzonatate 100-mg capsule p.o. p.r.n. for cough.  14. Protonix 40 mg p.o. daily.  15. Promethazine 25 mg p.o. daily.  16. Morphine/Roxanol 20 mg/mL concentration- 0.5 to 1 mL  p.o. as needed every four hours.  17. Senna Colace 8.6/50 mg, 1 tablet p.o. b.i.d.  18. Nitrostat sublingual 0.4 mg p.o. every five minutes p.r.n. for chest pain.  19. Nasal spray, two sprays each nostril at bedtime.   DISCHARGE OXYGEN: 6 liters home oxygen.  DISCHARGE DIET: Low sodium diet.   DISCHARGE ACTIVITY: As tolerated.    FOLLOW-UP INSTRUCTIONS:  1. Follow up with PCP in one week.  2. Wound Care Clinic followup in one week.  3. Resume Home Health and home Hospice at discharge.  LABORATORY DATA: WBC 7.2, hemoglobin 9.1, hematocrit 28.2, platelet count 182. Sodium 140, potassium 4.1, chloride 99, bicarbonate 33, BUN 21, creatinine 0.67, glucose 235, and calcium 8.4.  ALT 53, AST 30, alkaline phosphatase 69, total bilirubin 0.3, albumin 1.8. Serum digoxin 1.87.   ABG showing pH of 7.44, pCO2 50, pO2 105, and bicarbonate 34. Sats 98% on 6 liters nasal cannula.   CT of the head without contrast showing mild atrophy with chronic small vessel ischemic disease present. No acute intracranial abnormality present. Stable appearance.  Urine culture is growing mixed contaminant organisms. Chest x-ray showing pulmonary edema. No pneumothorax or evidence of other complications. Elevated troponin of 0.15 and 0.16 on admission. Urinalysis showing 2+ leukocyte esterase,  3+ bacteria, and 230 WBCs.    BRIEF HOSPITAL COURSE: Dalton Drake is a 81 year old elderly male with  a history of stroke, spinal stenosis, who is paraplegic and bedbound status, who has history of atrial fibrillation, pulmonary embolism on Arixtra subcutaneous injections, being take care of by his wife and Hospice at home. He was brought in secondary to acute respiratory distress requiring 100% FiO2 on admission.   1. Acute on chronic respiratory failure: Secondary to underlying sepsis, but no evidence of any pneumonia. Chronic obstructive pulmonary disease exacerbation present. The patient has a history of chronic respiratory failure secondary to pulmonary fibrosis, on 6 liters home oxygen.  He was initially placed on BiPAP with 100% FiO2 and admitted to the Critical Care Unit. He gradually improved his respiratory status and moved out of the Critical Care Unit. He is back to his baseline of 6 liters home oxygen.  2. Sepsis secondary to enterococcus bacteremia: The patient was recently treated with a course of Levaquin by his PCP and was also on Cipro and Bactrim secondary to turbid-looking urine as an outpatient. He has a chronic Foley catheter secondary to urinary retention, probably from his spinal stenosis versus stroke.  He was started on IV Rocephin in the hospital. His urine cultures were not immediately sent, and by the time they were sent he was already on antibiotics so they did grow anything, but his blood cultures have become positive for enterococcus bacteremia. He was seen by infectious disease specialist Dr. Leavy Cella who adjusted the antibiotics. The patient's enterococcus was resistant to both Cipro and Levaquin. It was only sensitive to ampicillin so he was placed on amoxicillin 3 times a day for a total of 14 days. He is being discharged on the same antibiotic. 3. Altered mental status with increased sleepiness while in the hospital: He was not retaining CO2 from the ABG and CT of the head was negative,  but Gabapentin was on his medication list that he was getting in the hospital, which was apparently stopped as an outpatient but his medication list still had it.  After stopping the Gabapentin the patient was more alert, talking, and at baseline according to wife.  4. History of deep venous thrombosis and pulmonary embolism: On Arixtra due to questionable history of heparin-induced thrombocytopenia. We  will continue that. He has a deep vein thrombosis in his right lower extremity.  5. Skin tear on the left forearm:  It seems that he had an IV line placed by paramedics in his left forearm that was taken out.  He does have delicate skin secondary to being on steroids  chronically for his pulmonary fibrosis and also has been on anticoagulation. He had a gauze dressing placed on his forearm that was removed after I saw the patient on Sunday.  It seems like a piece of gauze has been stuck to his skin and we have requested surgical consult by Dr. Michela Pitcher to see if that needs to be taken out under local anesthesia.   We also applied. Xylocaine gel to his wound. Dr. Michela Pitcher was able to take it off without any other anesthesia or surgical procedure. A DuoDERM was placed by Dr. Michela Pitcher as soon as he took the gauze out, so it is not advisable to take the DuoDERM off immediately because that might make his skin more irritated and cause more skin tears at this time.  He will need to follow up with his primary care physician or the wound care clinic in one week by which time the DuoDERM should be coming off of his arm.  6. Atrial fibrillation: Known history of intermittent  atrial fibrillation. He is on digoxin. His blood pressure improved so he was started back on Cardizem and low dose Bystolic, beta-blocker which will not interfere with his bronchospasm or wheezing.  7. History of pulmonary fibrosis causing chronic respiratory failure on 6 liters of home oxygen and chronic steroids.  He will follow up with pulmonary as an outpatient as scheduled. 8. CODE STATUS: FULL CODE. His course has been otherwise uneventful in the hospital.   DISCHARGE CONDITION: Stable.   DISCHARGE DISPOSITION: Home with Home Health and home with Hospice.   TIME SPENT ON DISCHARGE: 45 minutes.   ____________________________ Enid Baas, MD rk:bjt D: 11/19/2011 14:46:13 ET T: 11/20/2011 10:25:54 ET JOB#: [id number]  cc: Enid Baas, MD,  <Dictator> Rhona Leavens. Burnett Sheng, MD Enid Baas MD ELECTRONICALLY SIGNED 11/21/2011 11:55

## 2015-01-23 NOTE — H&P (Signed)
PATIENT NAME:  Dalton Drake, Dalton Drake MR#:  [id number] DATE OF BIRTH:  [birth year]  DATE OF ADMISSION:  11/13/2011  ADDENDUM: This is the medication list. I want to discontinue the previous one, which I had dictated, so this is the new medication list, which I got from hospice services.   MEDICATIONS:  1. Alprazolam 0.5 mg b.i.d.  2. Arixtra 7.5 mg subcutaneous daily.  3. Atorvastatin 40 mg daily.  4. Azathioprine 50 mg daily.  5. Benzonatate 100 mg capsule p.r.n.  6. Cardizem CD 120 mg at bedtime.  7. Cardizem CD 180 mg in the morning.  8. Combivent inhaler every 4 to 6 hour p.r.n.   9. Digoxin 0.25 mg daily.  10. Flomax 0.4 mg daily.  11. Flovent HFA two aerosol sprays twice a day.  12. Lasix 20 mg 1 tablet as needed.  13. Gabapentin 600 mg daily.  14. Metoprolol 50 mg 1 tablet b.i.d.  15. Nitrostat 0.4 mg one tablet sublingual as needed.  16. Norco 5/325 mg 1 tablet p.r.n.  17. NovoLog sliding scale.  18. Oxygen 6 liters per minute.  19. Protonix 40 mg daily.  20. Prednisone 20 mg 3 tablets one time a day, so that is 60 mg daily. The patient's wife does not know exactly whether he is on prednisone or not. She is going to check on it.  21. Roxanol 20 mg/mL, 0.5 to 1 mL p.r.n.  22. Senna Plus 1 tablet b.i.d.        23. Tamsulosin 0.4 mg 1 capsule. daily.  24. Tussionex as needed.  ____________________________ Wylie HailSona A. Allena KatzPatel, MD sap:slb D: 11/13/2011 18:47:34 ET T: 11/14/2011 06:56:55 ET JOB#: [id number]  cc: Chavy Avera A. Allena KatzPatel, MD, <Dictator> Willow OraSONA A Ceanna Wareing MD ELECTRONICALLY SIGNED 11/25/2011 11:36

## 2015-01-23 NOTE — Consult Note (Signed)
Patient admitted with respiratory failure, SIRS, has limited venous access.  Central line requested.  Will place at bedside  Electronic Signatures: Annice Needyew, Reice Bienvenue S (MD)  (Signed on 13-Feb-13 09:07)  Authored  Last Updated: 13-Feb-13 09:07 by Annice Needyew, Annalynn Centanni S (MD)

## 2015-01-23 NOTE — Op Note (Signed)
PATIENT NAME:  Dalton Drake, Sullivan MR#:  [id number] DATE OF BIRTH:  1933/12/17  DATE OF PROCEDURE:  11/14/2011  PREOPERATIVE DIAGNOSES:  1. Poor venous access.  2. Systemic antiinflammatory response syndrome.  3. Hypotension.  4. Respiratory failure, acute on chronic.   POSTOPERATIVE DIAGNOSES:  1. Poor venous access.  2. Systemic antiinflammatory response syndrome.  3. Hypotension.  4. Respiratory failure, acute on chronic.   PROCEDURES:  1. Ultrasound guidance for vascular access, right jugular vein.  2. Placement of right jugular triple lumen catheter.   SURGEON: Festus BarrenJason Docia Klar, MD  ANESTHESIA: Local.   ESTIMATED BLOOD LOSS: Minimal.   INDICATION FOR PROCEDURE: This is a 81 year old male who was admitted late last night with worsening respiratory failure and signs of sepsis with hypotension, low-grade fever, and tachycardia. He has poor venous access despite multiple attempts by the nursing staff and a central line is requested. The risks and benefits were discussed and informed consent was obtained from the family, as the patient was unable to provide his own consent.   DESCRIPTION OF PROCEDURE: The patient was laid flat in the critical care bed. His right jugular vein was small but patent. It was accessed under direct ultrasound guidance with minimal difficulty, and a J-wire was placed. After skin nick and dilatation, a triple lumen catheter was    placed over the wire, and the wire was removed. All three ports withdrew dark, red nonpulsatile blood and were flushed with sterile saline. It was secured to the skin at 18 cm with three silk sutures. ____________________________ Annice NeedyJason S. Briteny Fulghum, MD jsd:slb D: 11/14/2011 09:39:10 ET T: 11/14/2011 10:09:56 ET JOB#: [id number]  cc: Annice NeedyJason S. Caymen Dubray, MD, <Dictator> Annice NeedyJASON S Janette Harvie MD ELECTRONICALLY SIGNED 11/23/2011 13:24

## 2015-01-23 NOTE — Consult Note (Signed)
Impression: 81yo WM w/ h/o COPD, pulmonary fibrosis, chronic respiratory failure, CHF, DVT admitted with hypoxia, pyuria and GPC in the blood.  It is not clear to me that his pulmonary issues are related to infection.  He has multiple possible etiologies for his hypoxia including exacerbation or worsening of his underlying COPD/fibrosis, worsening of his CHF or possible PE.   His CXR does not show an obvious pneumonia.  He is not currently being treated for pneumonia.  I would not change his therapy at this time. His GPC in the blood could represent and infection vs contamination.  Will need to await the results of the blood cultures.  He has been started on vanco.  Will continue this until the cultures return. His urine issues are difficult to interpret.  I do not have the data on his prior cultures.  He has a chronic foley in place, and it is not uncommon to have chronic colonization with bacteria in such instances.  He has been on antibiotics, and the H&P indicates that he has developed resistant organisms.  This is the pattern that occurs when colonization is treated extensively.  Will wait for the current urine culture and see if we can get copies of the old cultures to evaluate.  He may not need the ceftriaxone. He was discharge in 08/2011 on home hospice, but is currently full code and in the CCU.  Given his end stage chronic lung disease would consider palliative care consult to discuss goals of therapy.  Electronic Signatures: Nabria Nevin, Rosalyn GessMichael E (MD)  (Signed on 13-Feb-13 11:52)  Authored  Last Updated: 13-Feb-13 11:52 by Mayo Faulk, Rosalyn GessMichael E (MD)
